# Patient Record
Sex: Female | Born: 1970 | Race: White | Hispanic: No | Marital: Married | Smoking: Never smoker
Health system: Southern US, Community
[De-identification: ages and names within clinical notes are randomized; demographics above are authoritative.]

## PROBLEM LIST (undated history)

## (undated) DIAGNOSIS — F329 Major depressive disorder, single episode, unspecified: Secondary | ICD-10-CM

## (undated) DIAGNOSIS — F32A Depression, unspecified: Secondary | ICD-10-CM

## (undated) DIAGNOSIS — F419 Anxiety disorder, unspecified: Secondary | ICD-10-CM

## (undated) HISTORY — DX: Major depressive disorder, single episode, unspecified: F32.9

## (undated) HISTORY — DX: Anxiety disorder, unspecified: F41.9

## (undated) HISTORY — DX: Depression, unspecified: F32.A

---
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
## 1998-06-10 ENCOUNTER — Emergency Department (HOSPITAL_COMMUNITY): Admission: EM | Admit: 1998-06-10 | Discharge: 1998-06-10 | Payer: Self-pay | Admitting: Emergency Medicine

## 2000-09-16 ENCOUNTER — Emergency Department (HOSPITAL_COMMUNITY): Admission: EM | Admit: 2000-09-16 | Discharge: 2000-09-17 | Payer: Self-pay | Admitting: Emergency Medicine

## 2001-08-03 ENCOUNTER — Other Ambulatory Visit: Admission: RE | Admit: 2001-08-03 | Discharge: 2001-08-03 | Payer: Self-pay | Admitting: Obstetrics and Gynecology

## 2005-05-11 ENCOUNTER — Encounter: Admission: RE | Admit: 2005-05-11 | Discharge: 2005-05-11 | Payer: Self-pay | Admitting: Orthopaedic Surgery

## 2007-09-07 ENCOUNTER — Other Ambulatory Visit: Admission: RE | Admit: 2007-09-07 | Discharge: 2007-09-07 | Payer: Self-pay | Admitting: Obstetrics and Gynecology

## 2008-02-24 ENCOUNTER — Ambulatory Visit (HOSPITAL_COMMUNITY): Admission: RE | Admit: 2008-02-24 | Discharge: 2008-02-24 | Payer: Self-pay | Admitting: Family Medicine

## 2011-04-21 ENCOUNTER — Emergency Department: Payer: Self-pay | Admitting: Internal Medicine

## 2012-09-07 IMAGING — CT CT CHEST W/ CM
1 series · 16 of 31 positions shown, 20 images · IV contrast (isovue)
Comparison: none

REASON FOR EXAM: cp sob elev dd
COMMENTS:

PROCEDURE:     CT  - CT CHEST (FOR PE) W  - April 21, 2011 [DATE]
RESULT:     Comparison: None
TECHNIQUE: Multiple thin section axial images were obtained from the lung
apices to the upper abdomen following 85 ml Isovue 370 intravenous contrast,
according to the PE protocol. These images were also reviewed on a Siemens
multiplanar work station.

[Series 4: soft tissue · axial · 0.56mm/px · z∈[-336,-78]mm · 16 of 94 slices shown, 20 images]
[im 4/94  mediastinal]
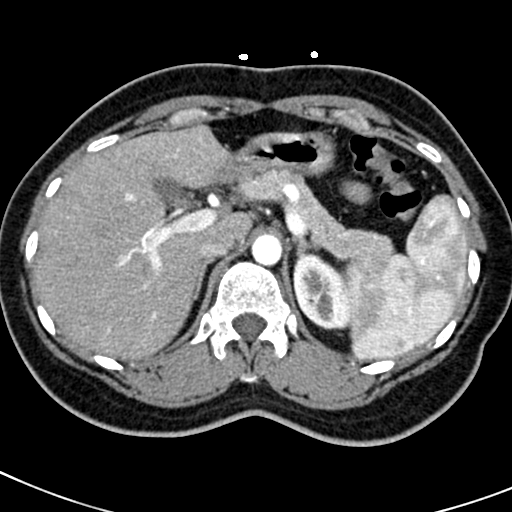
[im 4/94  lung]
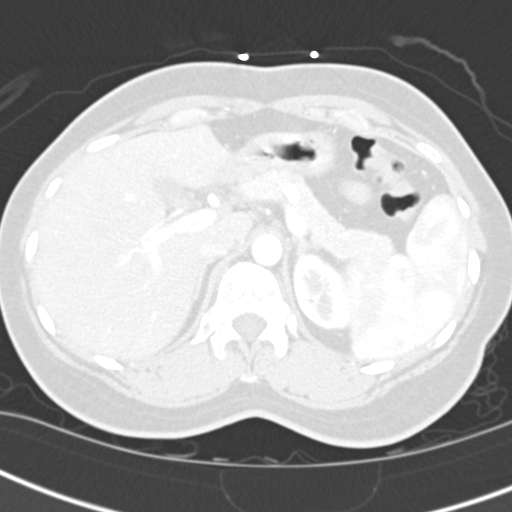
[im 11/94  lung]
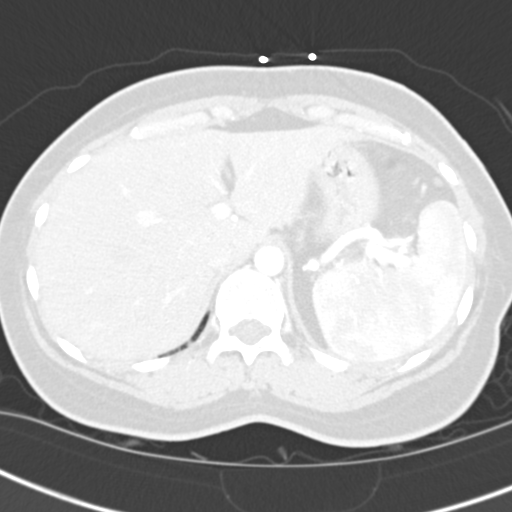
[im 18/94  lung]
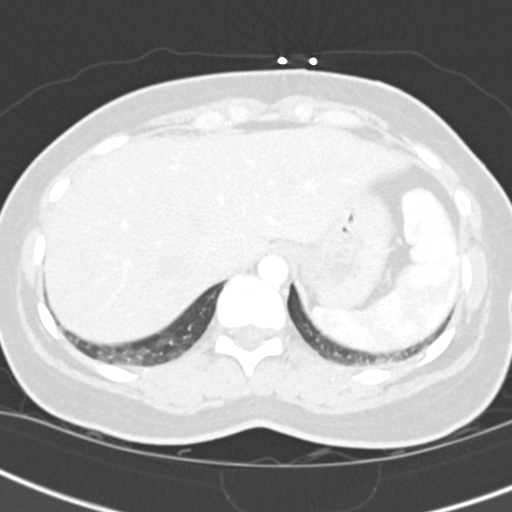
[im 21/94  lung]
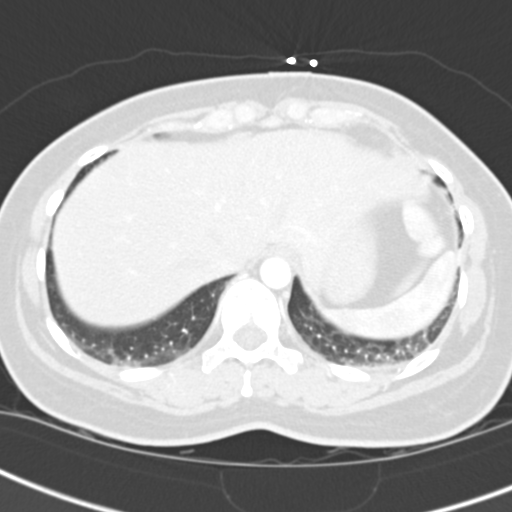
[im 28/94  mediastinal]
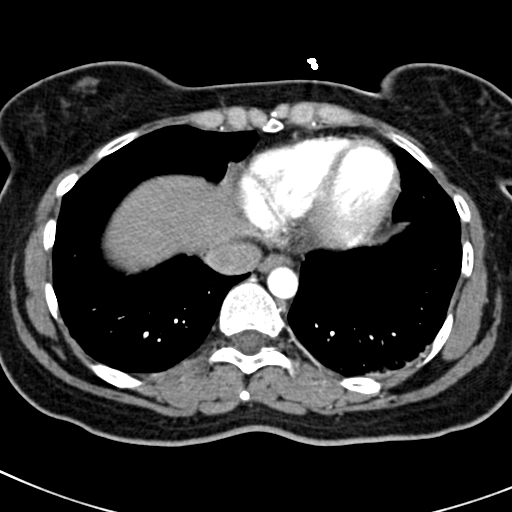
[im 28/94  lung]
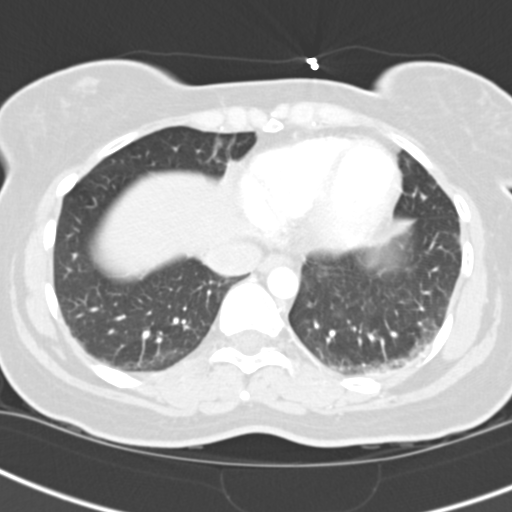
[im 32/94  lung]
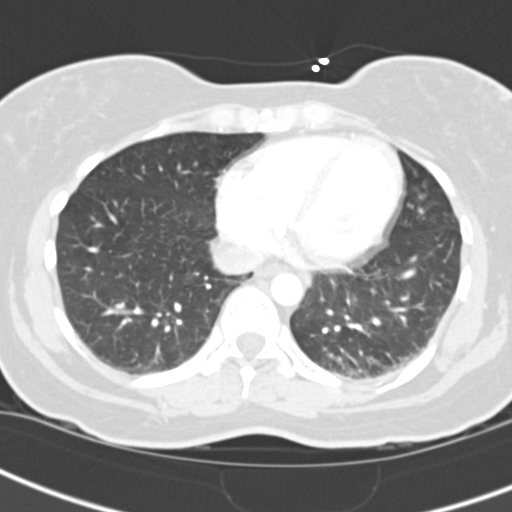
[im 38/94  lung]
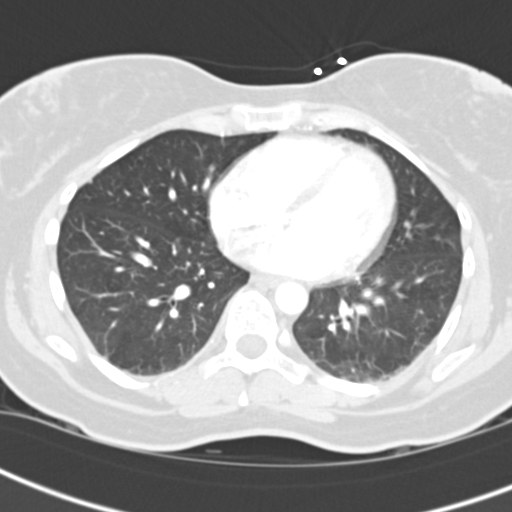
[im 45/94  lung]
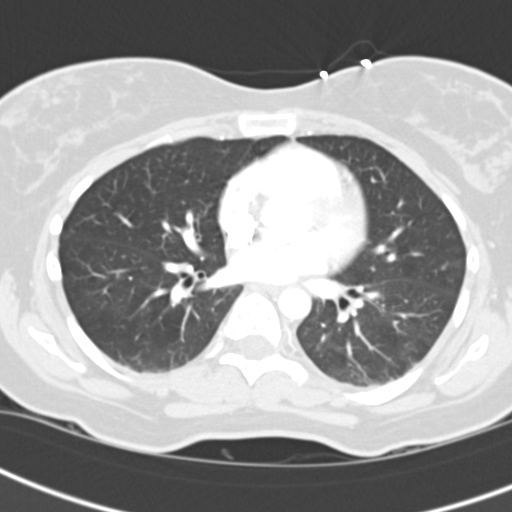
[im 50/94  mediastinal]
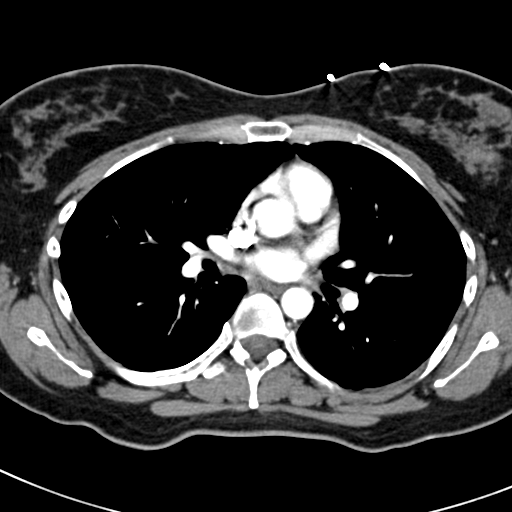
[im 50/94  lung]
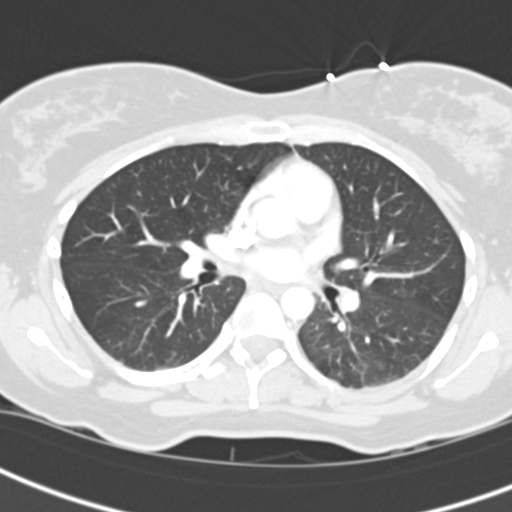
[im 56/94  lung]
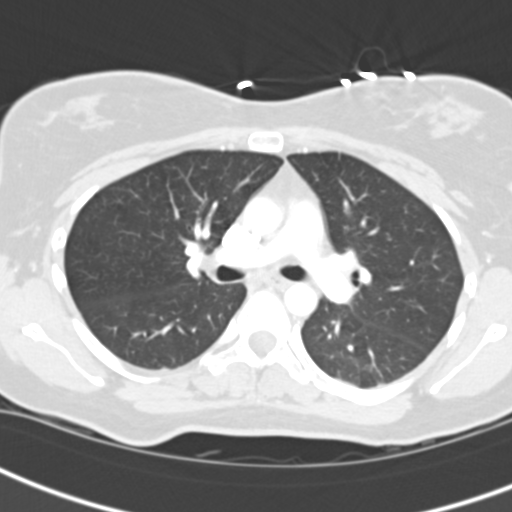
[im 63/94  lung]
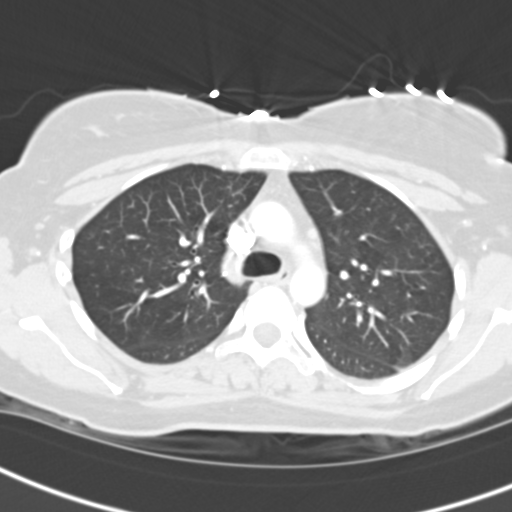
[im 66/94  lung]
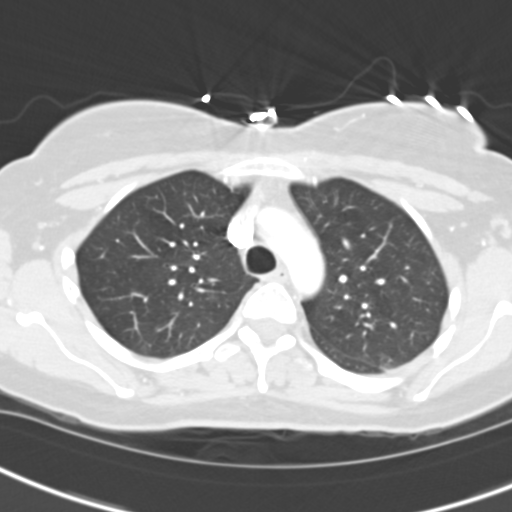
[im 73/94  mediastinal]
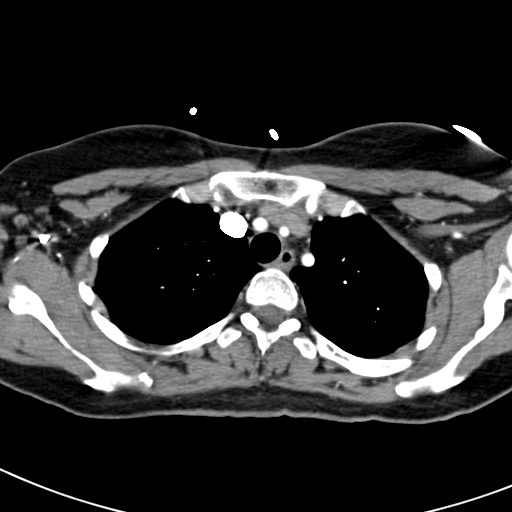
[im 73/94  lung]
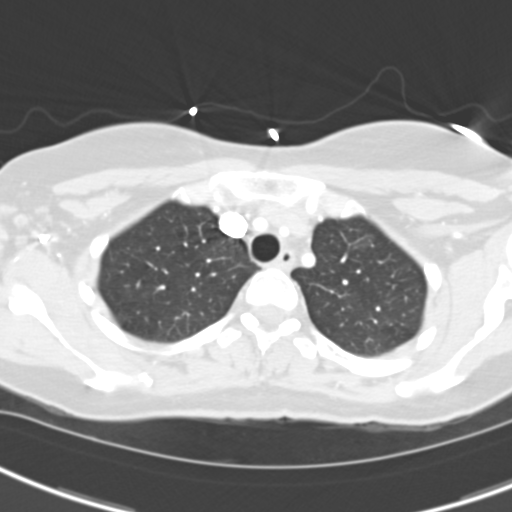
[im 76/94  lung]
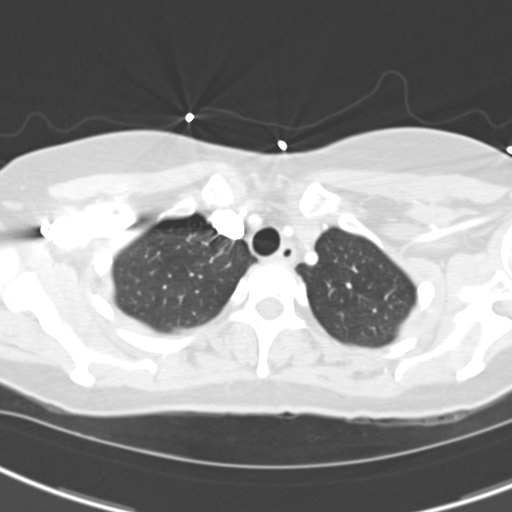
[im 83/94  lung]
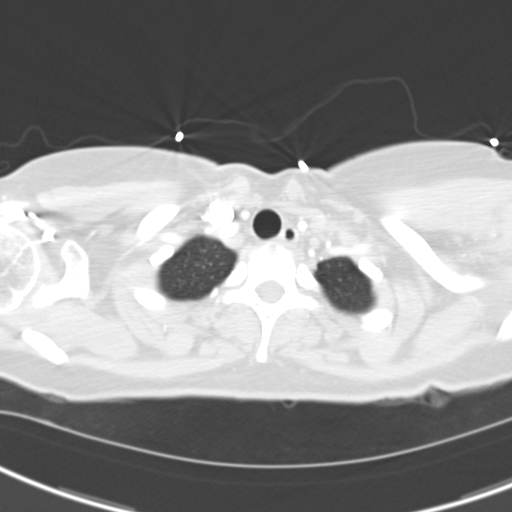
[im 90/94  lung]
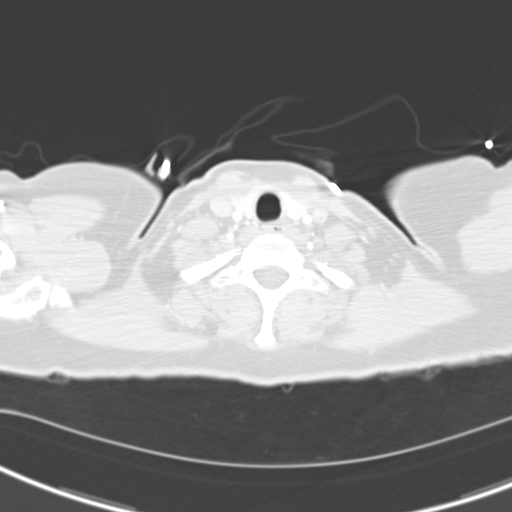

[16 of 31 positions shown; findings below may reference images not displayed]

FINDINGS: No mediastinal, hilar, or axillary lymphadenopathy.

The thoracic aorta is normal in caliber. No pulmonary embolus identified.

Mild dependent basilar opacities likely represent atelectasis.

No aggressive lytic or sclerotic osseous lesions identified.
IMPRESSION: No pulmonary embolus identified.

## 2014-10-21 ENCOUNTER — Encounter: Payer: Self-pay | Admitting: Podiatry

## 2014-10-21 ENCOUNTER — Ambulatory Visit (INDEPENDENT_AMBULATORY_CARE_PROVIDER_SITE_OTHER): Payer: BC Managed Care – PPO

## 2014-10-21 ENCOUNTER — Ambulatory Visit (INDEPENDENT_AMBULATORY_CARE_PROVIDER_SITE_OTHER): Payer: BC Managed Care – PPO | Admitting: Podiatry

## 2014-10-21 VITALS — BP 110/78 | HR 74 | Resp 12

## 2014-10-21 DIAGNOSIS — M205X2 Other deformities of toe(s) (acquired), left foot: Secondary | ICD-10-CM | POA: Diagnosis not present

## 2014-10-21 DIAGNOSIS — M79672 Pain in left foot: Secondary | ICD-10-CM

## 2014-10-21 DIAGNOSIS — M2010 Hallux valgus (acquired), unspecified foot: Secondary | ICD-10-CM

## 2014-10-21 DIAGNOSIS — M79671 Pain in right foot: Secondary | ICD-10-CM | POA: Diagnosis not present

## 2014-10-21 NOTE — Progress Notes (Signed)
   Subjective:    Patient ID: Alison Strong, female    DOB: Oct 23, 1970, 44 y.o.   MRN: [id number]  HPI 44 year old female presents the office with complaints of bilateral bunions with a right more symptomatically the left. She doesn't the left side is worse over the right side does hurt more. She states that this has been ongoing for approximately one year and his been progressive. She has tried exercises to help the bunion which she read on the Internet otherwise she's had no prior treatment. She states that she has pain with certain shoes and pressure overlying the area. No other complaints at this time.   Review of Systems  All other systems reviewed and are negative.      Objective:   Physical Exam  AAO 3, NAD  DP/PT pulses palpable, CRT less than 3 seconds  Protective sensation intact with Simms Weinstein monofilament, vibratory sensation intact, Achilles tendon reflex intact. 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 There is more tenderness along the right foot however. There is prominence of the medial aspect of the first metatarsal head and lateral deviation of the hallux bilaterally. There is limitation of range of motion on the right first MTPJ however there is no pain associated with range of motion. There is no crepitation. There is no snapping hypermobility identified on the right at this time. No other areas of tenderness to bilateral lower extremities. MMT 5/5, ROM WNL No open lesions or pre-ulcerative lesions identified bilaterally. No pain with calf compression, swelling, warmth, erythema.      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 -Treatment options both conservative and surgical were discussed including alternatives, risks, complications. -I discussed conservative treatment with the patient  which include wider shoes, padding, offloading. I asked discussed with patient surgical intervention. She states that she would likely proceed with surgical intervention in the near future given the pain. She states that she would likely start with the right foot as it is more symptomatic. The proposed surgery for the left side included [place] bunionectomy with resection of dorsal spur off the 1st metatarsal head, possible [place] bunionectomy. I discussed the patient the surgery as well as alternatives risks and complications as well as the postoperative course. She will at her schedule and decide on surgery. -If she elects to proceed with surgery to call the office for follow-up appointment. Otherwise follow-up as needed. In the meantime encouraged to call the office with any questions, concerns, change in symptoms.

## 2014-10-21 NOTE — Patient Instructions (Signed)
Bunionectomy A bunionectomy is surgery to remove a bunion. A bunion is an enlargement of the joint at the base of the big toe. It is made up of bone and soft tissue on the inside part of the joint. Over time, a painful lump appears on the inside of the joint. The big toe begins to point inward toward the second toe. New bone growth can occur and a bone spur may form. The pain eventually causes difficulty walking. A bunion usually results from inflammation caused by the irritation of poorly fitting shoes. It often begins later in life. A bunionectomy is performed when nonsurgical treatment no longer works. When surgery is needed, the extent of the procedure will depend on the degree of deformity of the foot. Your surgeon will discuss with you the different procedures and what will work best for you depending on your age and health. LET YOUR CAREGIVER KNOW ABOUT:   Previous problems with anesthetics or medicines used to numb the skin.  Allergies to dyes, iodine, foods, and/or latex.  Medicines taken including herbs, eye drops, prescription medicines (especially medicines used to "thin the blood"), aspirin and other over-the-counter medicines, and steroids (by mouth or as a cream).  History of bleeding or blood problems.  Possibility of pregnancy, if this applies.  History of blood clots in your legs and/or lungs .  Previous surgery.  Other important health problems. RISKS AND COMPLICATIONS   Infection.  Pain.  Nerve damage.  Possibility that the bunion will recur. BEFORE THE PROCEDURE  You should be present 60 minutes prior to your procedure or as directed.  PROCEDURE  Surgery is often done so that you can go home the same day (outpatient). It may be done in a hospital or in an outpatient surgical center. An anesthetic will be used to help you sleep during the procedure. Sometimes, a spinal anesthetic is used to make you numb below the waist. A cut (incision) is made over the swollen  area at the first joint of the big toe. The enlarged lump will be removed. If there is a need to reposition the bones of the big toe, this may require more than 1 incision. The bone itself may need to be cut. Screws and wires may be used in the repair. These can be removed at a later date. In severe cases, the entire joint may need to be removed and a joint replacement inserted. When done, the incision is closed with stitches (sutures). Skin adhesive strips may be added for reinforcement. They help hold the incision closed.  AFTER THE PROCEDURE  Compression bandages (dressings) are then wrapped around the wound. This helps to keep the foot in alignment and reduce swelling. Your foot will be monitored for bleeding and swelling. You will need to stay for a few hours in the recovery area before being discharged. This allows time for the anesthesia to wear off. You will be discharged home when you are awake, stable, and doing well. HOME CARE INSTRUCTIONS   You can expect to return to normal activities within 6 to 8 weeks after surgery. The foot is at increased risk for swelling for several months. When you can expect to bear weight on the operated foot will depend on the extent of your surgery. The milder the deformity, the less tissue is removed and the sooner the return to normal activity level. During the recovery period, a special shoe, boot, or cast may be worn to accommodate the surgical bandage and to help provide stability   to the foot.  Once you are home, an ice pack applied to the operative site may help with discomfort and keep swelling down. Stop using the ice if it causes discomfort.  Keep your feet raised (elevated) when possible to lessen swelling.  If you have an elastic bandage on your foot and you have numbness, tingling, or your foot becomes cold and blue, adjust the bandage to make it comfortable.  Change dressings as directed.  Keep the wound dry and clean. The wound may be washed  gently with soap and water. Gently blot dry without rubbing. Do not take baths or use swimming pools or hot tubs for 10 days, or as instructed by your caregiver.  Only take over-the-counter or prescription medicines for pain, discomfort, or fever as directed by your caregiver.  You may continue a normal diet as directed.  For activity, use crutches with no weight bearing or your orthopedic shoe as directed. Continue to use crutches or a cane as directed until you can stand without causing pain. SEEK MEDICAL CARE IF:   You have redness, swelling, bruising, or increasing pain in the wound.  There is pus coming from the wound.  You have drainage from a wound lasting longer than 1 day.  You have an oral temperature above 102 F (38.9 C).  You notice a bad smell coming from the wound or dressing.  The wound breaks open after sutures have been removed.  You develop dizzy episodes or fainting while standing.  You have persistent nausea or vomiting.  Your toes become cold.  Pain is not relieved with medicines. SEEK IMMEDIATE MEDICAL CARE IF:   You develop a rash.  You have difficulty breathing.  You develop any reaction or side effects to medicines given.  Your toes are numb or blue, or you have severe pain. MAKE SURE YOU:   Understand these instructions.  Will watch your condition.  Will get help right away if you are not doing well or get worse. Document Released: 07/12/2005 Document Revised: 10/21/2011 Document Reviewed: 08/17/2007 [facility] Patient Information 2015 [place], [place]. This information is not intended to replace advice given to you by your health care provider. Make sure you discuss any questions you have with your health care provider. Bunion You have a bunion deformity of the feet. This is more common in women. It tends to be an inherited problem. Symptoms can include pain, swelling, and deformity around the great toe. Numbness and tingling may also be  present. Your symptoms are often worsened by wearing shoes that cause pressure on the bunion. Changing the type of shoes you wear helps reduce symptoms. A wide shoe decreases pressure on the bunion. An arch support may be used if you have flat feet. Avoid shoes with heels higher than two inches. This puts more pressure on the bunion. X-rays may be helpful in evaluating the severity of the problem. Other foot problems often seen with bunions include corns, calluses, and hammer toes. If the deformity or pain is severe, surgical treatment may be necessary. Keep off your painful foot as much as possible until the pain is relieved. Call your caregiver if your symptoms are worse.  SEEK IMMEDIATE MEDICAL CARE IF:  You have increased redness, pain, swelling, or other symptoms of infection. Document Released: 07/29/2005 Document Revised: 10/21/2011 Document Reviewed: 01/26/2007 [facility] Patient Information 2015 [place], [place]. This information is not intended to replace advice given to you by your health care provider. Make sure you discuss any questions you have  with your health care provider.  

## 2014-11-21 ENCOUNTER — Telehealth: Payer: Self-pay | Admitting: Podiatry

## 2014-11-25 ENCOUNTER — Encounter: Payer: Self-pay | Admitting: Podiatry

## 2014-11-25 ENCOUNTER — Ambulatory Visit (INDEPENDENT_AMBULATORY_CARE_PROVIDER_SITE_OTHER): Payer: BC Managed Care – PPO | Admitting: Podiatry

## 2014-11-25 VITALS — BP 109/72 | HR 81 | Resp 11

## 2014-11-25 DIAGNOSIS — M2010 Hallux valgus (acquired), unspecified foot: Secondary | ICD-10-CM

## 2014-11-25 NOTE — Patient Instructions (Signed)
Pre-Operative Instructions  Congratulations, you have decided to take an important step to improving your quality of life.  You can be assured that the doctors of [facility] will be with you every step of the way.  1. Plan to be at the surgery center/hospital at least 1 (one) hour prior to your scheduled time unless otherwise directed by the surgical center/hospital staff.  You must have a responsible adult accompany you, remain during the surgery and drive you home.  Make sure you have directions to the surgical center/hospital and know how to get there on time. 2. For hospital based surgery you will need to obtain a history and physical form from your family physician within 1 month prior to the date of surgery- we will give you a form for you primary physician.  3. We make every effort to accommodate the date you request for surgery.  There are however, times where surgery dates or times have to be moved.  We will contact you as soon as possible if a change in schedule is required.   4. No Aspirin/Ibuprofen for one week before surgery.  If you are on aspirin, any non-steroidal anti-inflammatory medications (Mobic, Aleve, Ibuprofen) you should stop taking it 7 days prior to your surgery.  You make take Tylenol  For pain prior to surgery.  5. Medications- If you are taking daily heart and blood pressure medications, seizure, reflux, allergy, asthma, anxiety, pain or diabetes medications, make sure the surgery center/hospital is aware before the day of surgery so they may notify you which medications to take or avoid the day of surgery. 6. No food or drink after midnight the night before surgery unless directed otherwise by surgical center/hospital staff. 7. No alcoholic beverages 24 hours prior to surgery.  No smoking 24 hours prior to or 24 hours after surgery. 8. Wear loose pants or shorts- loose enough to fit over bandages, boots, and casts. 9. No slip on shoes, sneakers are best. 10. Bring  your boot with you to the surgery center/hospital.  Also bring crutches or a walker if your physician has prescribed it for you.  If you do not have this equipment, it will be provided for you after surgery. 11. If you have not been contracted by the surgery center/hospital by the day before your surgery, call to confirm the date and time of your surgery. 12. Leave-time from work may vary depending on the type of surgery you have.  Appropriate arrangements should be made prior to surgery with your employer. 13. Prescriptions will be provided immediately following surgery by your doctor.  Have these filled as soon as possible after surgery and take the medication as directed. 14. Remove nail polish on the operative foot. 15. Wash the night before surgery.  The night before surgery wash the foot and leg well with the antibacterial soap provided and water paying special attention to beneath the toenails and in between the toes.  Rinse thoroughly with water and dry well with a towel.  Perform this wash unless told not to do so by your physician.  Enclosed: 1 Ice pack (please put in freezer the night before surgery)   1 Hibiclens skin cleaner   Pre-op Instructions  If you have any questions regarding the instructions, do not hesitate to call our office.  [place]: [address] [place], [place] [address] [phone]  [place]: [address] [place], [place] [address] [phone]  [place]: [address]  [place], [place] [address] [phone]  Dr. Richard   Tuchman DPM, Dr. Norman Regal DPM Dr. Richard Sikora DPM, Dr. M. Todd Hyatt DPM, Dr. Kathryn Egerton DPM, Dr. Taite Baldassari DPM 

## 2014-11-28 NOTE — Progress Notes (Signed)
Patient ID: Alison Strong, female   DOB: 04-25-1971, 44 y.o.   MRN: [id number]  Subjective: 44 year old female presents the office today for surgical consultation for left foot bunion deformity. She states that since last appointment she has considered her options and she has been attending shoe gear modifications and offloading and packing without any resolution. At this time she would proceed with surgical intervention. She denies any acute changes since last appointment and no other complaints at this time.  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 There is mild irritation over the medial aspect of the first metatarsal head from irritation. There is no significant hypermobility present. There is no pain with first MPJ range of motion there is no crepitation. No other areas of tenderness to bilateral lower extremities. MMT 5/5, ROM WNL.  No open lesions or pre-ulcerative lesions.  No overlying edema, erythema, increase in warmth to bilateral lower extremities.  No pain with calf compression, swelling, warmth, erythema bilaterally.   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 The proposed surgery is a left closing base wedge osteotomy with screw fixation. -The incision placement as well as the postoperative course was discussed with the patient. I discussed risks of the surgery which include, but not limited to, infection, bleeding, pain, swelling, need for further surgery, delayed or nonhealing, painful  or ugly scar, numbness or sensation changes, over/under correction, recurrence, transfer lesions, further deformity, hardware failure, DVT/PE, also toe/foot. Patient understands these risks and wishes to proceed with surgery. The surgical consent was reviewed with the patient all 3 pages were signed. No promises or guarantees were given to the outcome of the procedure. All questions were answered to the best of my ability. Before the surgery the patient was encouraged to call the office if there is any further questions. The surgery will be performed at the [facility] on an outpatient basis.

## 2014-12-09 DIAGNOSIS — B351 Tinea unguium: Secondary | ICD-10-CM

## 2014-12-26 ENCOUNTER — Encounter: Payer: Self-pay | Admitting: Podiatry

## 2014-12-26 DIAGNOSIS — M2010 Hallux valgus (acquired), unspecified foot: Secondary | ICD-10-CM | POA: Diagnosis not present

## 2014-12-30 ENCOUNTER — Other Ambulatory Visit: Payer: Self-pay | Admitting: Podiatry

## 2014-12-30 ENCOUNTER — Encounter: Payer: Self-pay | Admitting: Podiatry

## 2014-12-30 ENCOUNTER — Ambulatory Visit (INDEPENDENT_AMBULATORY_CARE_PROVIDER_SITE_OTHER): Payer: BC Managed Care – PPO

## 2014-12-30 ENCOUNTER — Ambulatory Visit (INDEPENDENT_AMBULATORY_CARE_PROVIDER_SITE_OTHER): Payer: BC Managed Care – PPO | Admitting: Podiatry

## 2014-12-30 VITALS — BP 142/86 | HR 72 | Resp 12

## 2014-12-30 DIAGNOSIS — Z9889 Other specified postprocedural states: Secondary | ICD-10-CM | POA: Diagnosis not present

## 2014-12-30 DIAGNOSIS — M2010 Hallux valgus (acquired), unspecified foot: Secondary | ICD-10-CM

## 2015-01-02 NOTE — Progress Notes (Signed)
Patient ID: Alison Strong, female   DOB: 1971/04/27, 44 y.o.   MRN: [id number]  12-26-14 s/p Left CBWO  Subjective: 44 year old female presents the office they for days status post left foot bunionectomy. She states that she is overall doing well and her pain is controlled with pain medication. She has been taking the antibiotics as directed. She has not required to Phenergan. She is remaining nonweightbearing. She is asking for a prescription for a rolling walker. She denies any systemic complaints as fevers, chills, nausea, vomiting. Denies any calf pain, chest pain, shortness of breath. No other complaints at this time in no acute changes.  Objective: AAO x3, NAD Cast is clean, dry, intact. The cast appears to be fitting well. There is no pain associated with the cast and the cast does not feel tight. Capillary refill time intact to all the digits. Motor function intact to digits. No pain with calf compression.  Assessment: 44 year old female 4 days' status post left foot closing base wedge osteotomy, doing well  Plan: -X-rays were obtained and reviewed with the patient. -Treatment options were discussed including alternatives, risks, complications. -Recommended continue nonweightbearing all times. A prescription for a rolling walker was given to the patient. -Pain medication as needed -Ice and elevation -Finish course of antibiotics -Full dose ASA -Monitoring signs or symptoms of infection and her DVT/PE and directed to call the office immediately should any occur or go to the ER. -Follow-up in one week for cast change and likely suture removal or sooner if any problems are to arise. In the meantime, call the office with any questions, concerns, change in symptoms.

## 2015-01-05 ENCOUNTER — Ambulatory Visit (INDEPENDENT_AMBULATORY_CARE_PROVIDER_SITE_OTHER): Payer: BC Managed Care – PPO | Admitting: Podiatry

## 2015-01-05 DIAGNOSIS — Z9889 Other specified postprocedural states: Secondary | ICD-10-CM

## 2015-01-05 DIAGNOSIS — M2012 Hallux valgus (acquired), left foot: Secondary | ICD-10-CM

## 2015-01-05 DIAGNOSIS — M21612 Bunion of left foot: Secondary | ICD-10-CM

## 2015-01-05 NOTE — Progress Notes (Signed)
DOS 12/26/2014 Left closing base wedge osteotomy with screw/wire fixation (surgical correction of bunion".

## 2015-01-09 NOTE — Progress Notes (Signed)
Patient ID: IBTISAM BENGE, female   DOB: November 08, 1970, 44 y.o.   MRN: [id number]  12-26-14 s/p Left CBWO  Subjective: Ms. Coggin presents the office today 2 weeks status post left foot bunionectomy. She states that she is overall doing well and her pain is controlled with pain medication. She is remaining nonweightbearing with the use of a rolling walker.  She has had no falls since using the walker. She denies any systemic complaints as fevers, chills, nausea, vomiting. Denies any calf pain, chest pain, shortness of breath. No other complaints at this time in no acute changes.  Objective: AAO x3, NAD Cast is clean, dry, intact.  DP/PT pulses palpable b/l, CRT < 3 sec Protective sensation intact with Simms Weinstein monofilament Incision along the dorsal medial aspect of the left foot as well coapted without any evidence of dehiscence and sutures are intact. There is no swelling erythema, ascending cellulitis, drainage, fluctuance, crepitus, malodor, or any other clinical signs of infection. There is mild edema overlying the surgical site. There is mild ecchymosis of the hallux. There is mild discomfort upon palpation of the surgical site. No other areas of tenderness to bilateral lower extremities. No other open lesions or pre-ulcer lesions identified bilaterally. No pain with calf compression, swelling, warmth, erythema.  Assessment: 44 year old female 2 weeks status post left foot closing base wedge osteotomy, doing well  Plan: -Treatment options were discussed including alternatives, risks, complications. -Suture ends were cut. Antibiotic ointment was applied of the incision followed by dry sterile dressing. A well-padded below-knee fiberglass cast was then applied making sure to pad off all bony prominences. -Pain medication as needed -Ice and elevation -Full dose ASA daily -Remain NWB -Monitoring signs or symptoms of infection and her DVT/PE and directed to call the office immediately  should any occur or go to the ER. -Follow-up in 2 weeks for cast change and x-rays or sooner if any problems are to arise. In the meantime, call the office with any questions, concerns, change in symptoms.

## 2015-01-19 ENCOUNTER — Ambulatory Visit (INDEPENDENT_AMBULATORY_CARE_PROVIDER_SITE_OTHER): Payer: BC Managed Care – PPO

## 2015-01-19 ENCOUNTER — Ambulatory Visit (INDEPENDENT_AMBULATORY_CARE_PROVIDER_SITE_OTHER): Payer: BC Managed Care – PPO | Admitting: Podiatry

## 2015-01-19 VITALS — BP 108/70 | HR 62 | Resp 16

## 2015-01-19 DIAGNOSIS — M2012 Hallux valgus (acquired), left foot: Secondary | ICD-10-CM | POA: Diagnosis not present

## 2015-01-19 DIAGNOSIS — Z9889 Other specified postprocedural states: Secondary | ICD-10-CM

## 2015-01-19 NOTE — Progress Notes (Signed)
Patient ID: Alison Strong, female   DOB: 1971/04/18, 44 y.o.   MRN: [id number]  12-26-14 s/p Left CBWO  Subjective: Alison Strong presents the office today 3.5 weeks status post left foot bunionectomy. She states that she is overall doing well and her pain is controlled with pain medication. She is remaining nonweightbearing with the use of a rolling walker.  She denies any systemic complaints as fevers, chills, nausea, vomiting. Denies any calf pain, chest pain, shortness of breath. No other complaints at this time in no acute changes.  Objective: AAO x3, NAD Cast is clean, dry, intact.  DP/PT pulses palpable b/l, CRT < 3 sec Protective sensation intact with Simms Weinstein monofilament Incision along the dorsal medial aspect of the left foot as well coapted without any evidence of dehiscence and sutures are intact. There is no surrounding erythema, ascending cellulitis, drainage, fluctuance, crepitus, malodor, or any other clinical signs of infection. There is trace edema overlying the surgical site. There is mild ecchymosis of the hallux although improved. There is no discomfort upon palpation of the surgical site. No other areas of tenderness to bilateral lower extremities. No other open lesions or pre-ulcer lesions identified bilaterally. No pain with calf compression, swelling, warmth, erythema.  Assessment: 44 year old female 3.5 weeks status post left foot closing base wedge osteotomy, doing well  Plan: -X-rays were obtained and reviewed with the patient. -Treatment options were discussed including alternatives, risks, complications. -Antibiotic ointment was applied of the incision followed by dry sterile dressing. A well-padded below-knee fiberglass cast was then applied making sure to pad off all bony prominences. -Pain medication as needed -Ice and elevation -Full dose ASA daily -Remain NWB -Monitoring signs or symptoms of infection and her DVT/PE and directed to call the office  immediately should any occur or go to the ER. -Follow-up in 2 weeks or sooner if any problems are to arise. In the meantime, call the office with any questions, concerns, change in symptoms. At that time will likely start to transition to [facility] in a CAM walker

## 2015-02-09 ENCOUNTER — Ambulatory Visit (INDEPENDENT_AMBULATORY_CARE_PROVIDER_SITE_OTHER): Payer: BC Managed Care – PPO

## 2015-02-09 ENCOUNTER — Ambulatory Visit (INDEPENDENT_AMBULATORY_CARE_PROVIDER_SITE_OTHER): Payer: BC Managed Care – PPO | Admitting: Podiatry

## 2015-02-09 ENCOUNTER — Encounter: Payer: Self-pay | Admitting: Podiatry

## 2015-02-09 DIAGNOSIS — Z9889 Other specified postprocedural states: Secondary | ICD-10-CM

## 2015-02-09 DIAGNOSIS — M2012 Hallux valgus (acquired), left foot: Secondary | ICD-10-CM

## 2015-02-10 ENCOUNTER — Encounter: Payer: Self-pay | Admitting: Podiatry

## 2015-02-10 NOTE — Progress Notes (Signed)
Patient ID: Alison Strong, female   DOB: 1971/05/24, 44 y.o.   MRN: [id number]  12-26-14 s/p Left CBWO  Subjective: Alison Strong presents the office today 6 weeks status post left foot bunionectomy. Overall she states that she is doing well and she has no pain and she has not required any pain medication. She has remained nonweightbearing with the use of a rolling walker and crutches. She denies any systemic complaints as fevers, chills, nausea, vomiting. Denies any calf pain, chest pain, shortness of breath. No other complaints at this time in no acute changes.  Objective: AAO x3, NAD Cast is clean, dry, intact.  DP/PT pulses palpable b/l, CRT < 3 sec Protective sensation intact with Simms Weinstein monofilament Incision along the dorsal medial aspect of the left foot as well coapted without any evidence of dehiscence and sutures are intact. There is no surrounding erythema, ascending cellulitis, drainage, fluctuance, crepitus, malodor, or any other clinical signs of infection. There is mild edema overlying the surgical site.There is slight discomfort upon palpation of the surgical site. No other areas of tenderness to bilateral lower extremities. No other open lesions or pre-ulcer lesions identified bilaterally. No pain with calf compression, swelling, warmth, erythema.  Assessment: 44 year old female 6 weeks status post left foot closing base wedge osteotomy, doing well  Plan: -X-rays were obtained and reviewed with the patient. -Treatment options were discussed including alternatives, risks, complications. -Antibiotic ointment was applied of the incision followed by dry sterile dressing.  -At this time we'll transition to partial weightbearing in a Cam Walker. The Cam [employer]ford Rile was dispensed to her today.. She can gradually start to be WBAT over the next couple of weeks but discussed with her this needs to be a gradual process.  If there is any increase in pain to return to  nonweightbearing -She can shower at this time. There is any problems in the incision to hold off on showering call the office. -Pain medication as needed -Ice and elevation -Monitoring signs or symptoms of infection and her DVT/PE and directed to call the office immediately should any occur or go to the ER. -Follow-up in 2 weeks or sooner if any problems are to arise. In the meantime, call the office with any questions, concerns, change in symptoms. -She would likely need to remain off work for possibly 6 more weeks until she is able to wear a regular shoe without difficulty.  Alison Strong, DPM

## 2015-02-23 ENCOUNTER — Ambulatory Visit (INDEPENDENT_AMBULATORY_CARE_PROVIDER_SITE_OTHER): Payer: BC Managed Care – PPO

## 2015-02-23 ENCOUNTER — Ambulatory Visit (INDEPENDENT_AMBULATORY_CARE_PROVIDER_SITE_OTHER): Payer: BC Managed Care – PPO | Admitting: Podiatry

## 2015-02-23 DIAGNOSIS — Z9889 Other specified postprocedural states: Secondary | ICD-10-CM

## 2015-02-23 DIAGNOSIS — M21612 Bunion of left foot: Secondary | ICD-10-CM

## 2015-02-23 DIAGNOSIS — M2012 Hallux valgus (acquired), left foot: Secondary | ICD-10-CM

## 2015-02-26 NOTE — Progress Notes (Signed)
Patient ID: Alison Strong, female   DOB: Feb 12, 1971, 44 y.o.   MRN: [id number]  12-26-14 s/p Left CBWO  Subjective: Alison Strong presents the office today status post left foot bunionectomy. Overall she states that she is doing well.  She says the area aches intermittently however she is not have much pain she is not requiring any pain medication. Since last appointment she has been weightbearing as tolerated in the cam walker without the use of any crutches. She has no pain with ambulating in the [employer]. She denies any systemic complaints as fevers, chills, nausea, vomiting. Denies any calf pain, chest pain, shortness of breath. No other complaints at this time in no acute changes.  Objective: AAO x3, NAD Cast is clean, dry, intact.  DP/PT pulses palpable b/l, CRT < 3 sec Protective sensation intact with Simms Weinstein monofilament Incision along the dorsal medial aspect of the left foot as well coapted without any evidence of dehiscence  Scar is formed.There is no surrounding erythema, ascending cellulitis, drainage, fluctuance, crepitus, malodor, or any other clinical signs of infection. There is mild edema overlying the surgical site although improved.There is  minimaldiscomfort upon palpation of the surgical site. No other areas of tenderness to bilateral lower extremities. No other open lesions or pre-ulcer lesions identified bilaterally. No pain with calf compression, swelling, warmth, erythema.  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 -Treatment options were discussed including alternatives, risks, complications. -At this time she can start to transition back into a regular, supportive sneaker as tolerated. States be a slow gradual transition if there is any increase in pain to return to the [employer]. -Prescription for physical therapy was also given the patient to help  with the transition rehabilitation. -Ice and elevation -Follow-up 3 weeks or sooner if any problems arise. In the meantime, encouraged to call the office with any questions, concerns, change in symptoms.   Alison Strong, DPM

## 2015-03-23 ENCOUNTER — Encounter: Payer: Self-pay | Admitting: Podiatry

## 2015-03-23 ENCOUNTER — Ambulatory Visit (INDEPENDENT_AMBULATORY_CARE_PROVIDER_SITE_OTHER): Payer: BC Managed Care – PPO | Admitting: Podiatry

## 2015-03-23 ENCOUNTER — Ambulatory Visit (INDEPENDENT_AMBULATORY_CARE_PROVIDER_SITE_OTHER): Payer: BC Managed Care – PPO

## 2015-03-23 VITALS — BP 107/73 | HR 79 | Resp 17

## 2015-03-23 DIAGNOSIS — M2012 Hallux valgus (acquired), left foot: Secondary | ICD-10-CM

## 2015-03-23 DIAGNOSIS — Z9889 Other specified postprocedural states: Secondary | ICD-10-CM

## 2015-03-23 NOTE — Progress Notes (Signed)
Patient ID: SULLIVAN BLASING, female   DOB: Apr 02, 1971, 44 y.o.   MRN: [id number]  12-26-14 s/p Left CBWO  Subjective: Alison Strong presents the office today status post left foot bunionectomy. Overall she states that she is improving. She states that she continues to have some swelling to her foot although does improve. She's been going to physical therapy which seems to help as well. She still is unable to wear her work boots or a regular tennis shoe at times. She She denies any systemic complaints as fevers, chills, nausea, vomiting. Denies any calf pain, chest pain, shortness of breath. No other complaints at this time in no acute changes.  Objective: AAO x3, NAD; presents today in a sandal  DP/PT pulses palpable b/l, CRT < 3 sec Protective sensation intact with Simms Weinstein monofilament Incision along the dorsal medial aspect of the left foot as well coapted without any evidence of dehiscence and a scar is well formed.There is no surrounding erythema, ascending cellulitis, drainage, fluctuance, crepitus, malodor, or any other clinical signs of infection. There is mild edema overlying the surgical site. There is  Minimal discomfort upon palpation of the surgical site. No other areas of tenderness to bilateral lower extremities. No other open lesions or pre-ulcer lesions identified bilaterally. No pain with calf compression, swelling, warmth, erythema.  Assessment: 44 year old female status post left foot closing base wedge osteotomy, doing well  Plan: -X-rays were obtained and reviewed with the patient.hardware is intact. There is increased consolidation across the osteotomy site. -Treatment options were discussed including alternatives, risks, complications. -continue transition into a regular shoe as tolerated. -she is scheduled for 4 complaints second of this month. She's no better at that time as long as she is able to wear her work boot.  -Continue PT. -Ice and elevation -Dispensed  compression sock -Follow-up 4 weeks or sooner if any problems arise. In the meantime, encouraged to call the office with any questions, concerns, change in symptoms.   Celesta Gentile, DPM

## 2015-03-27 ENCOUNTER — Telehealth: Payer: Self-pay | Admitting: *Deleted

## 2015-03-27 NOTE — Telephone Encounter (Signed)
"  Paperwork needs to be filled out in regards to work and faxed back."

## 2015-03-27 NOTE — Telephone Encounter (Signed)
Is this referring to FMLA? I don't have anything on this patient. Please advise.

## 2015-03-28 ENCOUNTER — Telehealth: Payer: Self-pay | Admitting: *Deleted

## 2015-03-28 NOTE — Telephone Encounter (Signed)
I'm returning your call.  You had left a message about some paperwork.  "Don't worry about it, I go to the [place] office.  I took the paperwork there."  You did take the paperwork to [facility]?  "Yes , I did.  Thank you."

## 2015-03-29 ENCOUNTER — Telehealth: Payer: Self-pay | Admitting: Podiatry

## 2015-03-29 NOTE — Telephone Encounter (Signed)
I called and spoke to the patient. I have paperwork I will give to the [facility] staff. Thanks.

## 2015-03-29 NOTE — Telephone Encounter (Signed)
Patient walked in wanting to know if she can extend her physical therapy for several more weeks. She is still in pain and is still walking on the side of her left foot. She stated she is being hounded by [employer] about coming back to work. Please call her when you get a chance. Thank you.

## 2015-03-30 ENCOUNTER — Encounter: Payer: Self-pay | Admitting: Podiatry

## 2015-04-04 NOTE — Telephone Encounter (Signed)
The paperwork is already and faxed per Tammy. Lattie Haw

## 2015-04-20 ENCOUNTER — Ambulatory Visit (INDEPENDENT_AMBULATORY_CARE_PROVIDER_SITE_OTHER): Payer: BC Managed Care – PPO | Admitting: Podiatry

## 2015-04-20 ENCOUNTER — Encounter: Payer: Self-pay | Admitting: Podiatry

## 2015-04-20 VITALS — BP 113/78 | HR 98 | Resp 18

## 2015-04-20 DIAGNOSIS — Z9889 Other specified postprocedural states: Secondary | ICD-10-CM

## 2015-04-20 DIAGNOSIS — M2012 Hallux valgus (acquired), left foot: Secondary | ICD-10-CM | POA: Diagnosis not present

## 2015-04-20 NOTE — Progress Notes (Signed)
Patient ID: Alison Strong, female   DOB: April 23, 1971, 44 y.o.   MRN: [id number]  12-26-14 s/p Left CBWO  Subjective: Alison Strong presents the office today status post left foot bunionectomy. She states that she is doing and pressing 90% better than what she was previously. She is continuing to go to physical therapy for which she states that she is making steady improvement. She is able to a regular shoe without any difficulty.  She She denies any systemic complaints as fevers, chills, nausea, vomiting. Denies any calf pain, chest pain, shortness of breath. No other complaints at this time in no acute changes.  In regards to return to work she feels that she can go back to work foot standpoint. However she states that she needs to remain in a work due to her other medical issues. She was given contact her employer to discuss possible alternative arrangements.  Objective: AAO x3, NAD; presents today in a sandal  DP/PT pulses palpable b/l, CRT < 3 sec Protective sensation intact with Simms Weinstein monofilament Incision along the dorsal medial aspect of the left foot as well coapted without any evidence of dehiscence and a scar is well formed.There is no surrounding erythema, ascending cellulitis, drainage, fluctuance, crepitus, malodor, or any other clinical signs of infection. There is trace edema overlying the surgical site. There is minimal discomfort upon palpation of the surgical site, however she denies any specific pain to the area. No other areas of tenderness to bilateral lower extremities. No other open lesions or pre-ulcer lesions identified bilaterally. No pain with calf compression, swelling, warmth, erythema.  Assessment: 44 year old female status post left foot closing base wedge osteotomy, doing well  Plan: -Treatment options were discussed including alternatives, risks, complications. -Continue with regular shoe gear as tolerated. -Continue physical therapy as needed. -Ice and  elevation -Continue compression sock -Follow-up 6-8 weeks for long term check or sooner if any problems arise. In the meantime, encouraged to call the office with any questions, concerns, change in symptoms.  *xray next appointment.   Celesta Gentile, DPM

## 2015-04-24 ENCOUNTER — Telehealth: Payer: Self-pay | Admitting: *Deleted

## 2015-04-24 NOTE — Telephone Encounter (Signed)
Pt states she had a form faxed to the [place] office to release her for work and out of Dr. Leigh Aurora care, the form needs to be completed and faxed by 05/01/2015.

## 2015-04-26 ENCOUNTER — Telehealth: Payer: Self-pay | Admitting: Podiatry

## 2015-04-26 NOTE — Telephone Encounter (Signed)
Pt called and wanted to make sure her paperwork for her to return to work was faxed. She said she stopped in on Monday and saw the paperwork and left the fax number for it to be faxed to. Please call pt and let her know when it has been faxed.

## 2015-04-26 NOTE — Telephone Encounter (Signed)
I AM WORKING ON THE PAPERWORK FOR THIS PATIENT AND I WILL CALL HER WHEN I AM DONE AND LET DR Jacqualyn Posey LOOK OVER IT AS WELL. Alison Strong

## 2015-04-27 NOTE — Telephone Encounter (Signed)
Called pt 04-26-15 and left a message stating that I sent the disability papers to human resource dept [place], fax [phone]) and to return to work on 05-01-15. Lattie Haw

## 2016-04-24 ENCOUNTER — Ambulatory Visit (INDEPENDENT_AMBULATORY_CARE_PROVIDER_SITE_OTHER): Payer: BC Managed Care – PPO

## 2016-04-24 ENCOUNTER — Ambulatory Visit (INDEPENDENT_AMBULATORY_CARE_PROVIDER_SITE_OTHER): Payer: BC Managed Care – PPO | Admitting: Podiatry

## 2016-04-24 ENCOUNTER — Encounter: Payer: Self-pay | Admitting: Podiatry

## 2016-04-24 VITALS — BP 111/80 | HR 95 | Resp 16

## 2016-04-24 DIAGNOSIS — M25572 Pain in left ankle and joints of left foot: Secondary | ICD-10-CM

## 2016-04-24 DIAGNOSIS — S93402A Sprain of unspecified ligament of left ankle, initial encounter: Secondary | ICD-10-CM

## 2016-04-25 NOTE — Progress Notes (Signed)
Subjective:     Patient ID: Alison Strong, female   DOB: 19-Jul-1971, 45 y.o.   MRN: [id number]  HPI patient states she turned her left ankle and she heard a pop and she was concerned that there may be a fracture   Review of Systems     Objective:   Physical Exam Neurovascular status intact muscle strength adequate with patient found to have inflammation of the left ankle lateral side with fluid buildup noted and mild to moderate discomfort when palpated and moved    Assessment:     Probable sprain in the left ankle with no indication of tear    Plan:     Precautionary x-ray taken and advised on anti-inflammatories and went ahead and dispensed Tri-Lock ankle brace to provide better support. Reappoint as needed  X-ray report indicated that there is no indications of significant pathology and appears to be soft tissue

## 2016-10-04 ENCOUNTER — Ambulatory Visit
Admission: RE | Admit: 2016-10-04 | Discharge: 2016-10-04 | Disposition: A | Discharge status: Home / Self Care | Payer: BC Managed Care – PPO | Source: Ambulatory Visit | Attending: Unknown Physician Specialty | Admitting: Unknown Physician Specialty

## 2016-10-04 ENCOUNTER — Other Ambulatory Visit: Payer: Self-pay | Admitting: Unknown Physician Specialty

## 2016-10-04 DIAGNOSIS — R310 Gross hematuria: Secondary | ICD-10-CM

## 2016-10-04 DIAGNOSIS — N200 Calculus of kidney: Secondary | ICD-10-CM | POA: Diagnosis present

## 2016-10-04 DIAGNOSIS — R109 Unspecified abdominal pain: Secondary | ICD-10-CM | POA: Insufficient documentation

## 2017-03-13 IMAGING — US US RENAL
1 series · 14 of 25 positions shown · non-contrast
Comparison: CT scan from 3607

CLINICAL DATA: Right flank pain and gross hematuria since last
evening. History of renal calculi.

EXAM:
RENAL / URINARY TRACT ULTRASOUND COMPLETE

[Series 1: us renal · 14 of 44 slices shown]
[im 1/44]
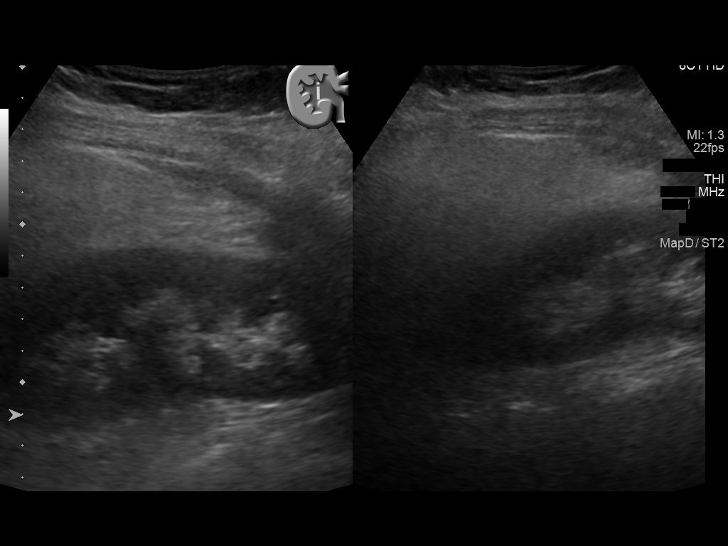
[im 4/44]
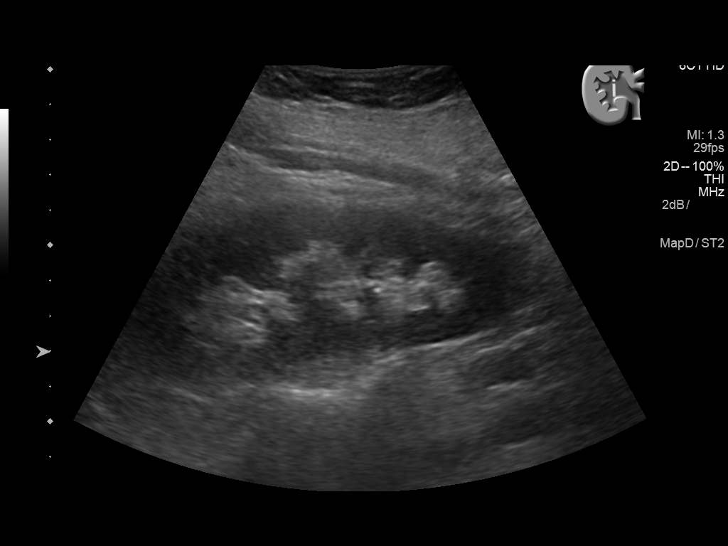
[im 8/44]
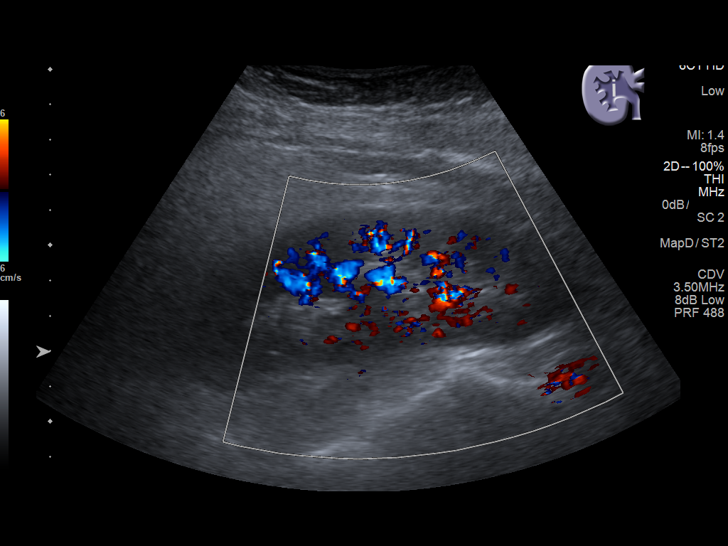
[im 11/44]
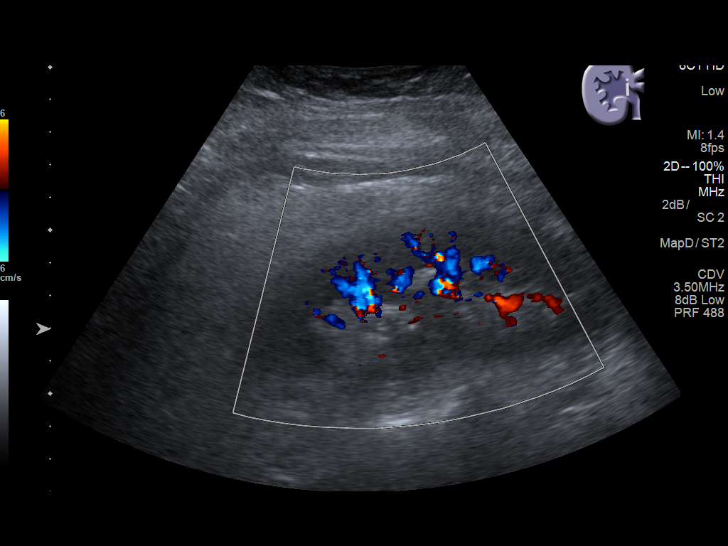
[im 15/44]
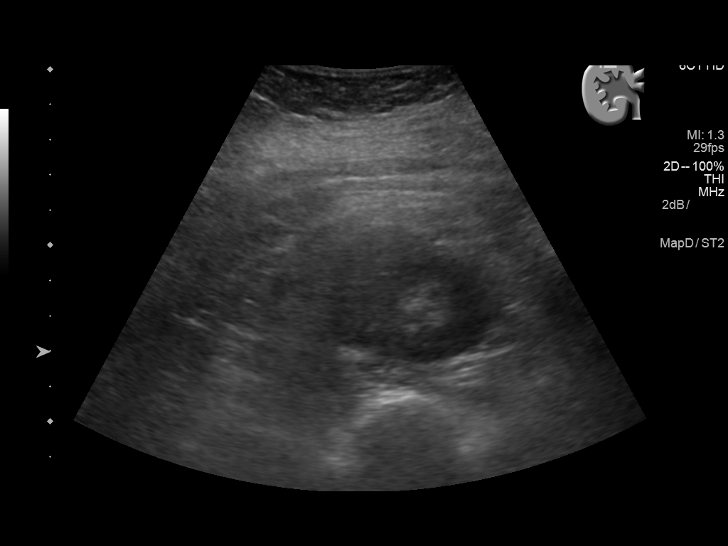
[im 17/44]
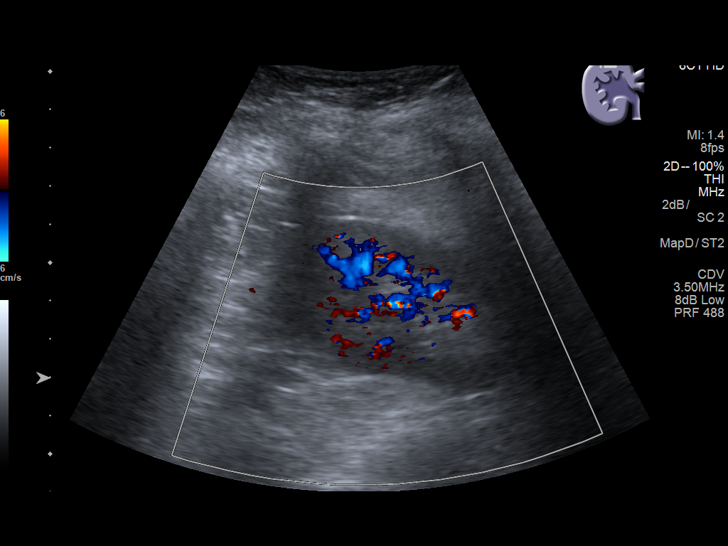
[im 20/44]
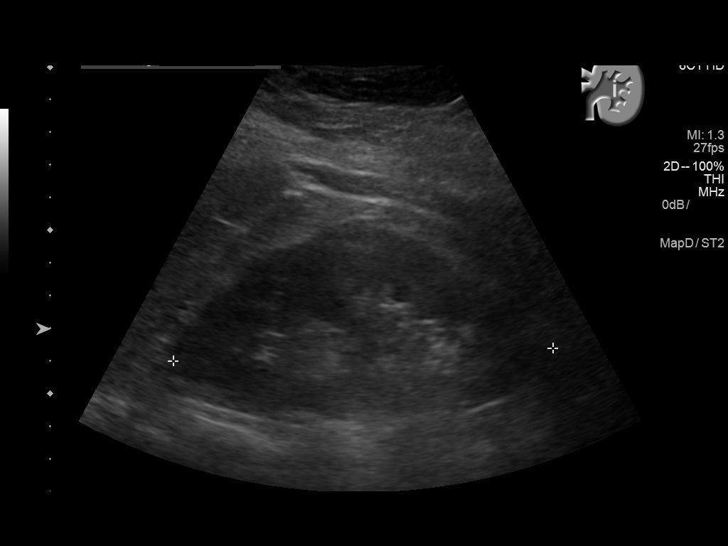
[im 24/44]
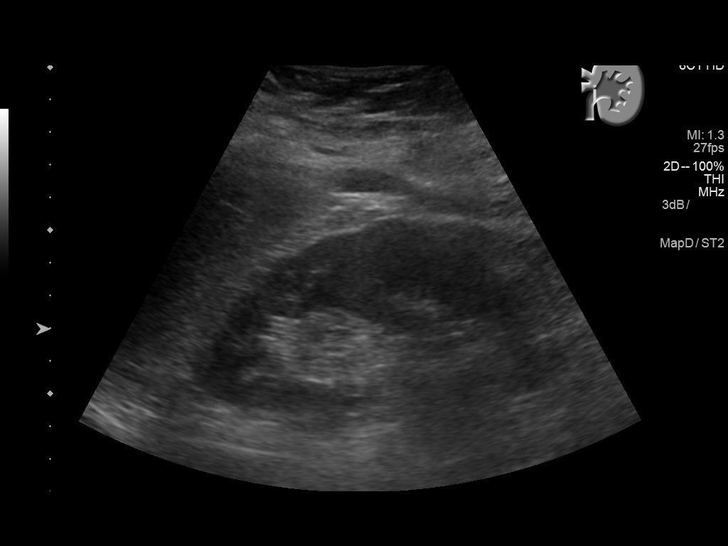
[im 27/44]
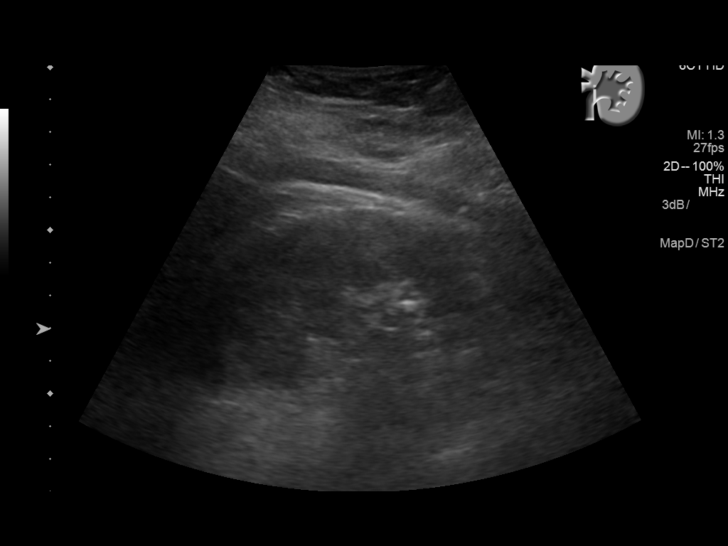
[im 29/44]
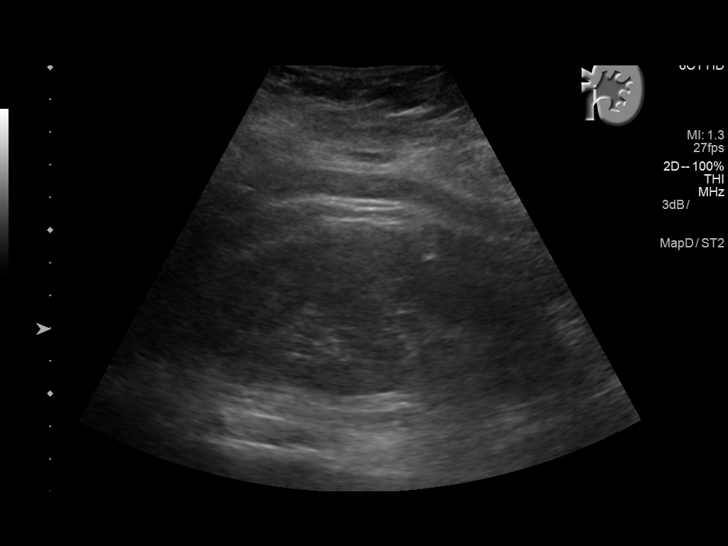
[im 33/44]
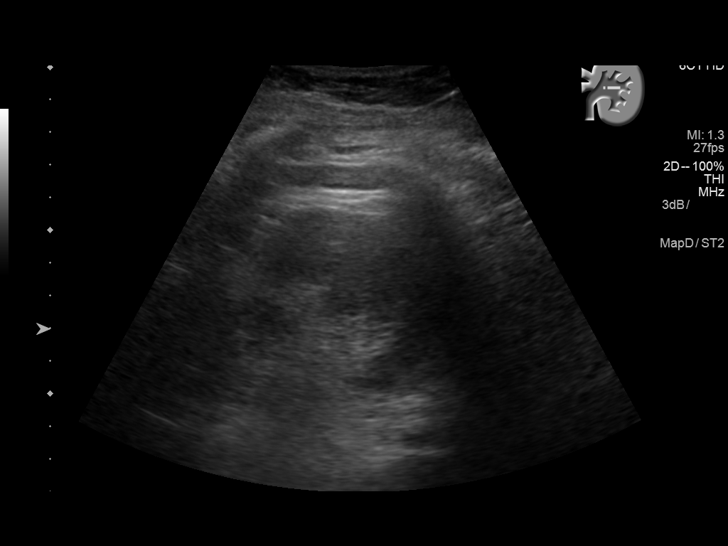
[im 36/44]
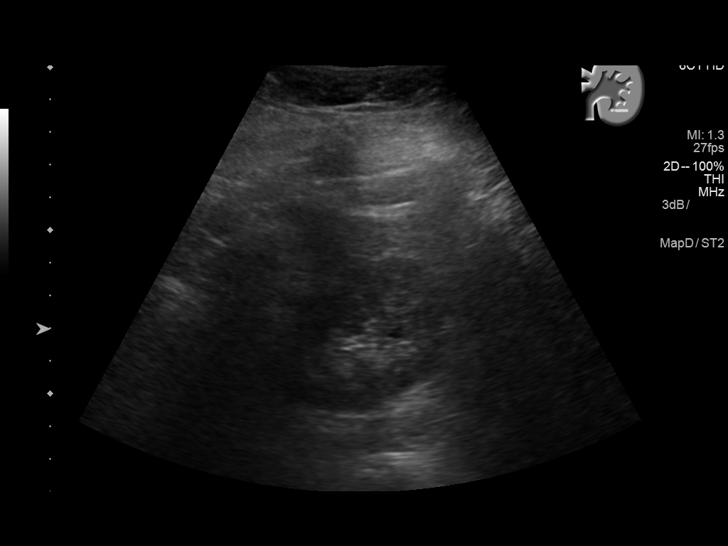
[im 40/44]
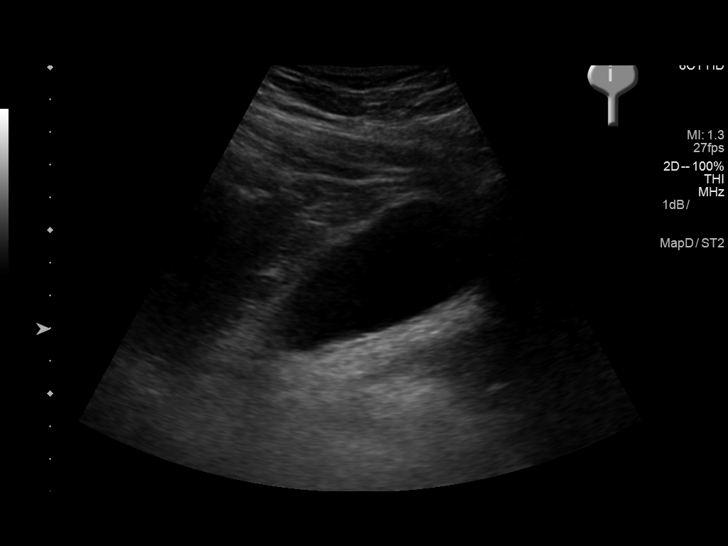
[im 44/44]
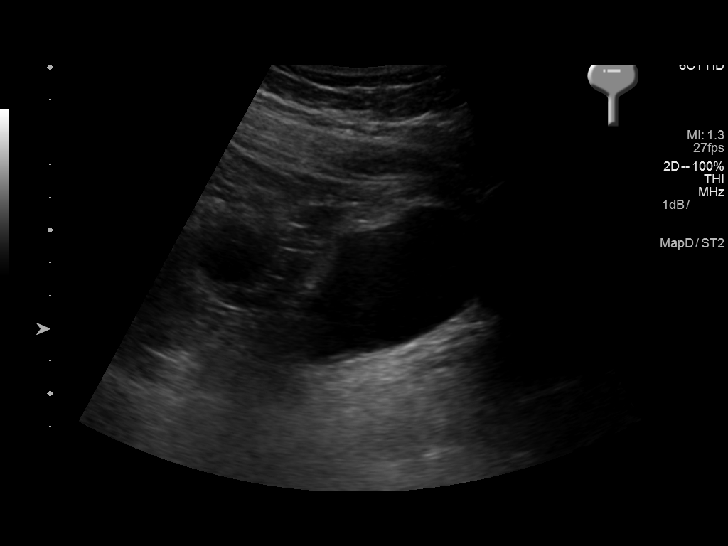

[14 of 25 positions shown; findings below may reference images not displayed]

FINDINGS: Right Kidney:

Length: 11.5 cm. Normal renal cortical thickness and echogenicity
without hydronephrosis. No obvious renal calculi.

Left Kidney:

Length: 11.6 cm. Normal renal cortical thickness and echogenicity
without focal lesion or hydronephrosis. There is a 7.5 mm midpole
renal calculus noted.

Bladder:

Normal.  Bilateral ureteral jets are noted.
IMPRESSION: 1. 7.5 mm left renal calculus. No findings to suggest obstructing
ureteral calculi.
2. Normal sonographic appearance of both kidneys. No hydronephrosis.

## 2020-02-09 ENCOUNTER — Encounter: Payer: Self-pay | Admitting: Dermatology

## 2020-02-09 ENCOUNTER — Ambulatory Visit: Payer: BC Managed Care – PPO | Admitting: Dermatology

## 2020-02-09 ENCOUNTER — Other Ambulatory Visit: Payer: Self-pay

## 2020-02-09 DIAGNOSIS — D229 Melanocytic nevi, unspecified: Secondary | ICD-10-CM

## 2020-02-09 DIAGNOSIS — Z1283 Encounter for screening for malignant neoplasm of skin: Secondary | ICD-10-CM

## 2020-02-09 DIAGNOSIS — L821 Other seborrheic keratosis: Secondary | ICD-10-CM

## 2020-02-09 DIAGNOSIS — D1800 Hemangioma unspecified site: Secondary | ICD-10-CM | POA: Diagnosis not present

## 2020-02-09 DIAGNOSIS — Z85820 Personal history of malignant melanoma of skin: Secondary | ICD-10-CM

## 2020-02-09 DIAGNOSIS — L814 Other melanin hyperpigmentation: Secondary | ICD-10-CM

## 2020-02-09 DIAGNOSIS — L578 Other skin changes due to chronic exposure to nonionizing radiation: Secondary | ICD-10-CM

## 2020-02-09 NOTE — Progress Notes (Signed)
   New patient   Subjective  Alison Strong is a 49 y.o. female who presents for the following: Annual Exam (Patient here for TBSE, Hx of Melanoma in 1989 L thigh). The patient presents for Total-Body Skin Exam (TBSE) for skin cancer screening and mole check.  The following portions of the chart were reviewed this encounter and updated as appropriate:  Allergies  Meds  Problems  Med Hx  Surg Hx  Fam Hx     Review of Systems:  No other skin or systemic complaints except as noted in HPI or Assessment and Plan.  Objective  Well appearing patient in no apparent distress; mood and affect are within normal limits.  A full examination was performed including scalp, head, eyes, ears, nose, lips, neck, chest, axillae, abdomen, back, buttocks, bilateral upper extremities, bilateral lower extremities, hands, feet, fingers, toes, fingernails, and toenails. All findings within normal limits unless otherwise noted below.  Objective  Left leg: Well healed scar with no evidence of recurrence, no lymphadenopathy.    Assessment & Plan    History of melanoma Left leg Clear. Observe for recurrence. Call clinic for new or changing lesions.  Recommend regular skin exams, daily broad-spectrum spf 30+ sunscreen use, and photoprotection.    Observe  Recommend daily broad spectrum sunscreen SPF 30+ to sun-exposed areas, reapply every 2 hours as needed. Call for new or changing lesions.   Lentigines - Scattered tan macules - Discussed due to sun exposure - Benign, observe - Call for any changes  Seborrheic Keratoses - Stuck-on, waxy, tan-brown papules and plaques  - Discussed benign etiology and prognosis. - Observe - Call for any changes  Melanocytic Nevi - Tan-brown and/or pink-flesh-colored symmetric macules and papules - Benign appearing on exam today - Observation - Call clinic for new or changing moles - Recommend daily use of broad spectrum spf 30+ sunscreen to sun-exposed  areas.   Hemangiomas - Red papules - Discussed benign nature - Observe - Call for any changes  Actinic Damage - diffuse scaly erythematous macules with underlying dyspigmentation - Recommend daily broad spectrum sunscreen SPF 30+ to sun-exposed areas, reapply every 2 hours as needed.  - Call for new or changing lesions.  Skin cancer screening performed today.  Return in about 1 year (around 02/08/2021) for TBSE .   IMarye Round, CMA, am acting as scribe for Sarina Ser, MD .  Documentation: I have reviewed the above documentation for accuracy and completeness, and I agree with the above.  Sarina Ser, MD

## 2020-02-09 NOTE — Patient Instructions (Signed)

## 2020-02-22 ENCOUNTER — Encounter: Payer: Self-pay | Admitting: Dermatology

## 2020-04-26 ENCOUNTER — Ambulatory Visit: Payer: BC Managed Care – PPO | Admitting: Dermatology

## 2020-05-01 ENCOUNTER — Ambulatory Visit: Payer: BC Managed Care – PPO

## 2020-11-03 ENCOUNTER — Other Ambulatory Visit: Payer: Self-pay | Admitting: Obstetrics and Gynecology

## 2020-11-03 DIAGNOSIS — Z1231 Encounter for screening mammogram for malignant neoplasm of breast: Secondary | ICD-10-CM

## 2020-12-27 ENCOUNTER — Ambulatory Visit: Payer: BC Managed Care – PPO

## 2021-02-08 ENCOUNTER — Encounter: Payer: BC Managed Care – PPO | Admitting: Dermatology

## 2021-02-21 ENCOUNTER — Encounter: Payer: BC Managed Care – PPO | Admitting: Dermatology
